# Patient Record
Sex: Male | Born: 1965 | Race: White | Hispanic: Yes | Smoking: Current some day smoker
Health system: Southern US, Community
[De-identification: ages and names within clinical notes are randomized; demographics above are authoritative.]

---

## 2007-01-22 IMAGING — CT HWWO
1 of 2 series · 13 of 30 positions shown, 17 images · non-contrast
Comparison: none

______________________________________________________________
STANGE, CARL-EMIL 06/25/1912 MASSIE, VIPIN [DATE]

REASON FOR CONSULTATION: Last Menstrual Date:..... NA Reason
p>for Consultation:. R/O Comment to Radiology:.... TODAY
 INFILTRATE
____________________________________________
EXAM: CHEST ROUTINE
Comparison is made to prior exam of 02 April, 1996.
The lungs are hyperexpanded compatible with chronic obstructive
pulmonary disease. The heart is mildly enlarged. There is tortuosity
of the thoracic aorta. No acute pulmonary infiltrate is noted and no
pleural fluid is seen. There is minimal chronic thickening of the
basilar interstitial markings. [

[Series 4: without contrast · axial · non-contrast · 0.49mm/px · z∈[+167,+297]mm · 13 of 32 slices shown, 17 images]
[im 3/32  brain]
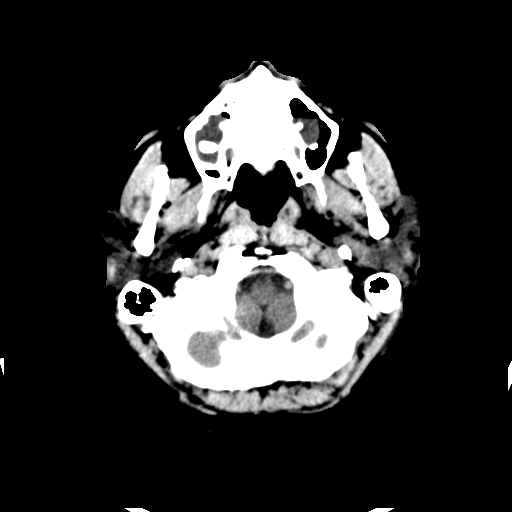
[im 3/32  bone]
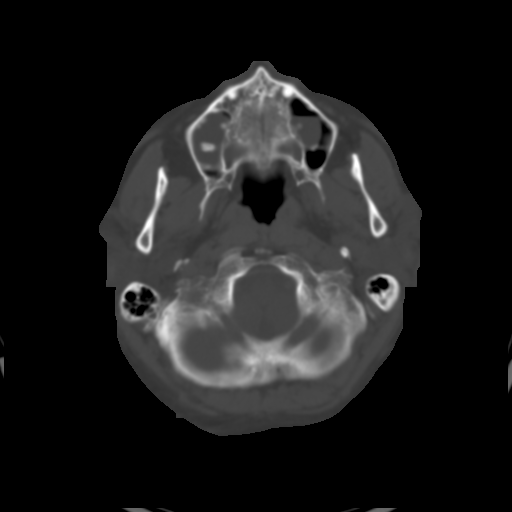
[im 5/32  brain]
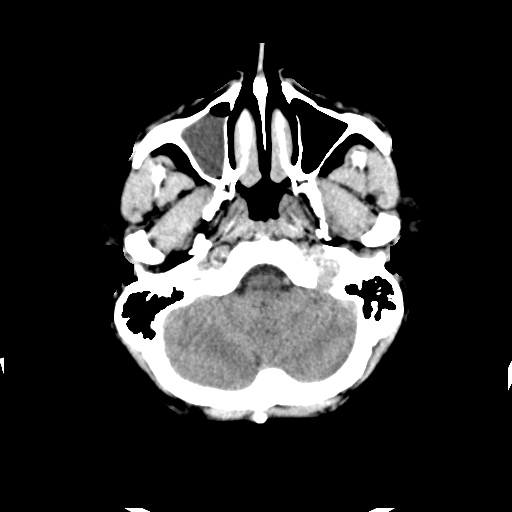
[im 7/32  brain]
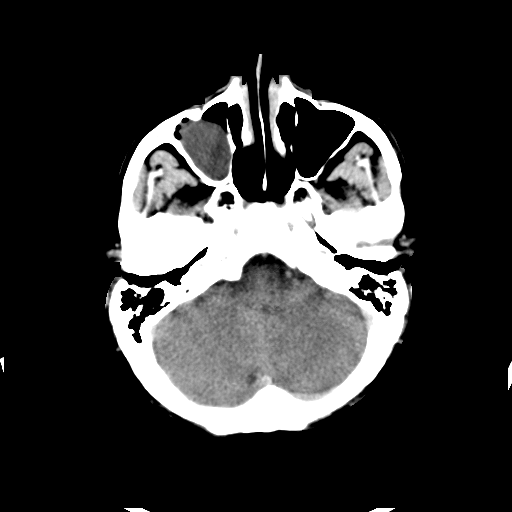
[im 9/32  brain]
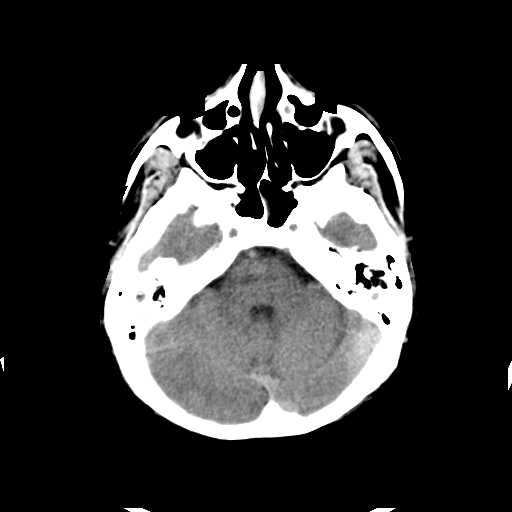
[im 12/32  brain]
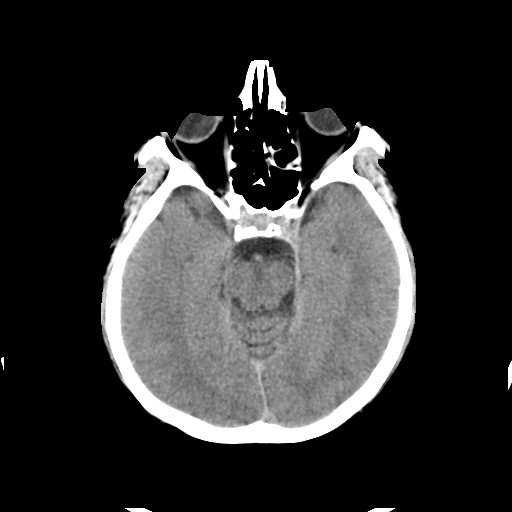
[im 12/32  bone]
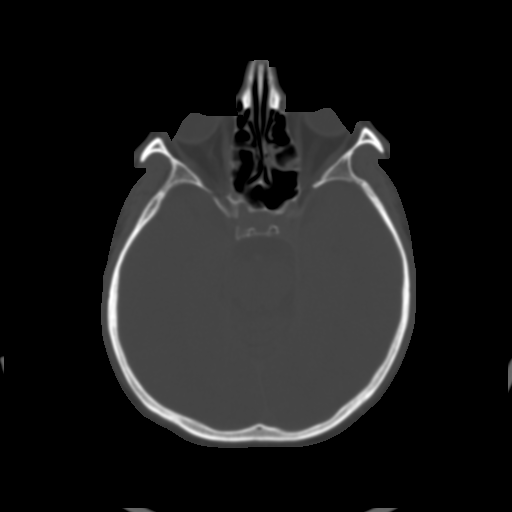
[im 14/32  brain]
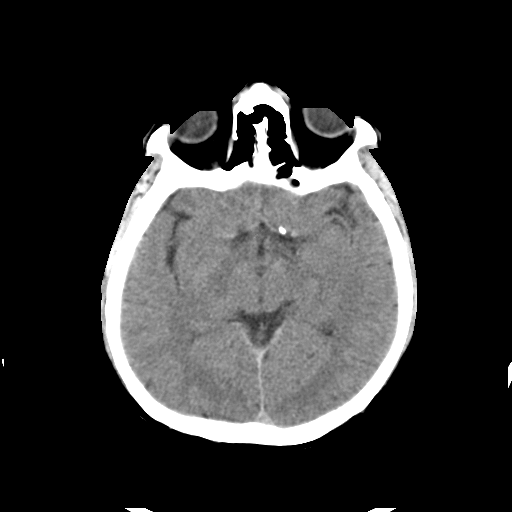
[im 16/32  brain]
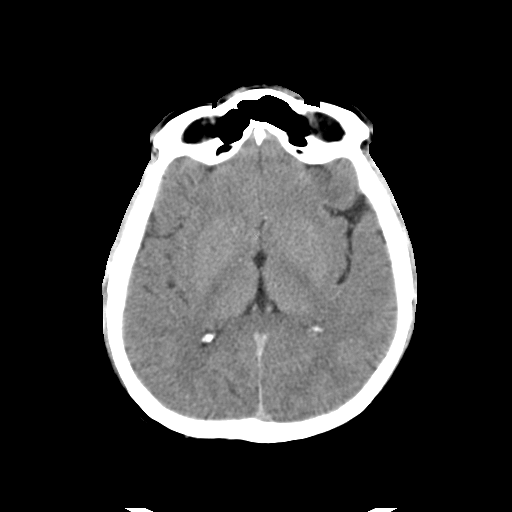
[im 18/32  brain]
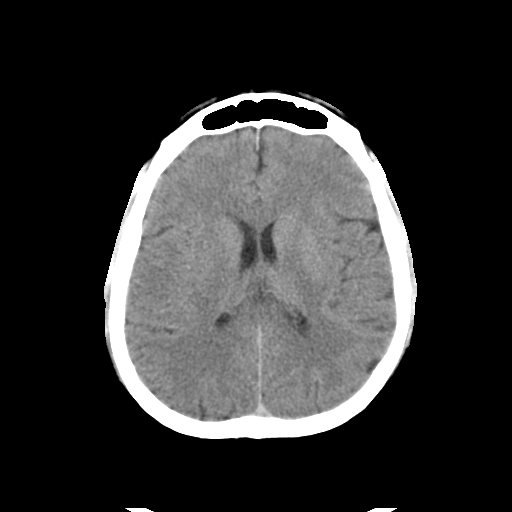
[im 20/32  brain]
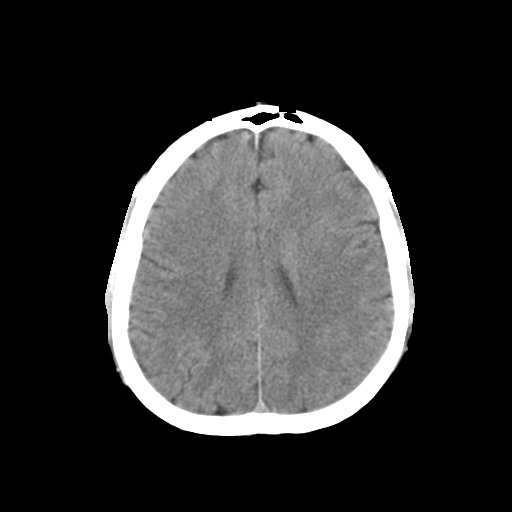
[im 20/32  bone]
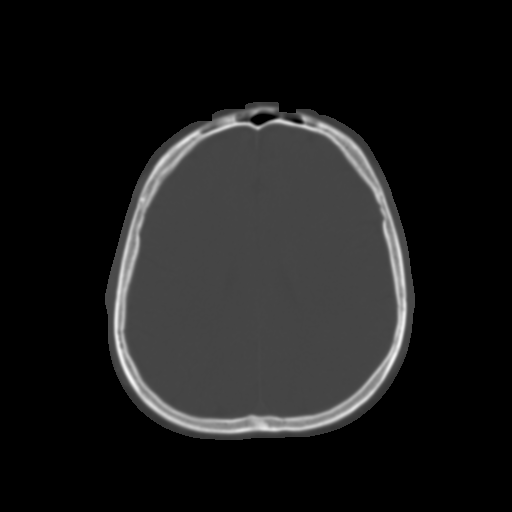
[im 23/32  brain]
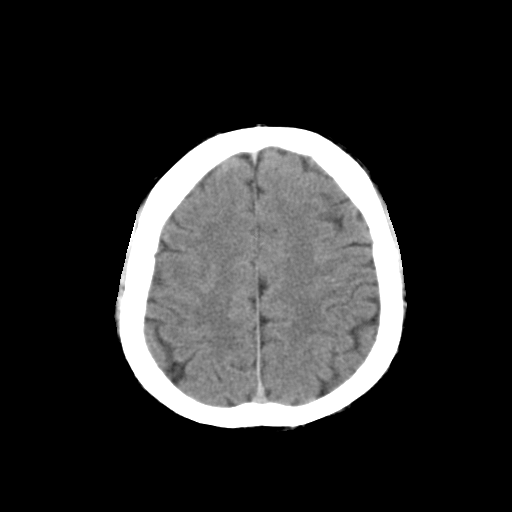
[im 25/32  brain]
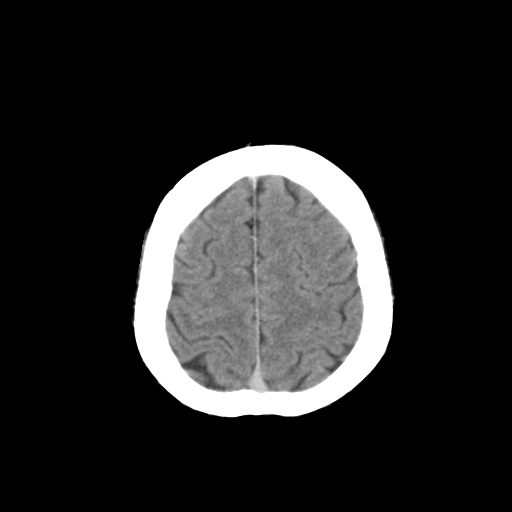
[im 27/32  brain]
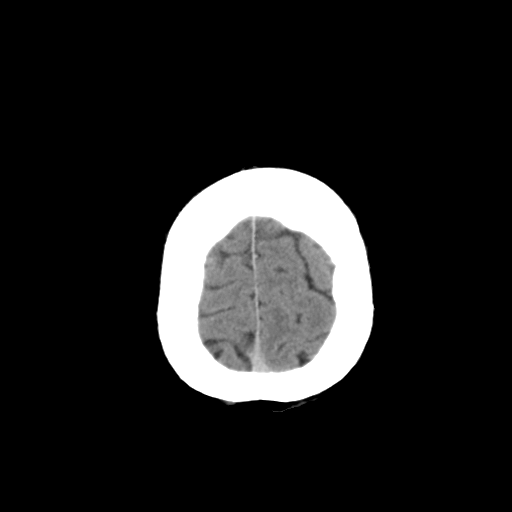
[im 29/32  brain]
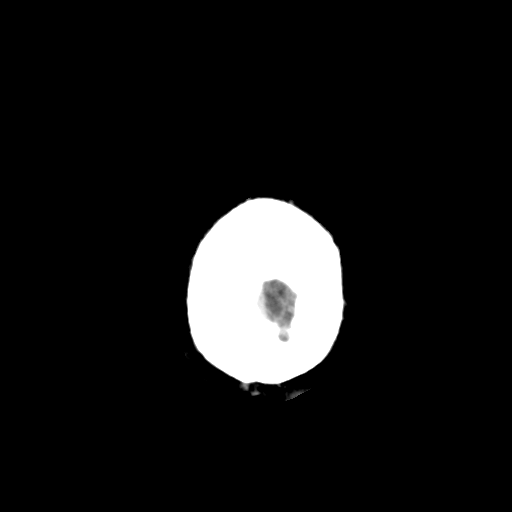
[im 29/32  bone]
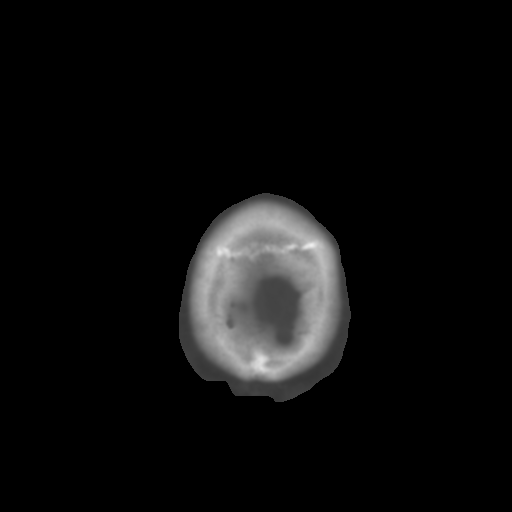

[13 of 30 positions shown; findings below may reference images not displayed]

CONCLUSION: 1. Chronic obstructive pulmonary disease.
2. Mild cardiomegaly. No acute process evident.

## 2008-01-08 ENCOUNTER — Emergency Department (HOSPITAL_COMMUNITY): Admission: EM | Admit: 2008-01-08 | Discharge: 2008-01-08 | Payer: Self-pay | Admitting: Emergency Medicine

## 2009-02-14 IMAGING — CR DG CHEST 2V
2 series · 2 of 2 positions shown · non-contrast
Comparison: None.

CLINICAL DATA: 41 year old male with chest pain and smoking history.  
 CHEST - 2 VIEW - 01/08/08:

[w chest pa]
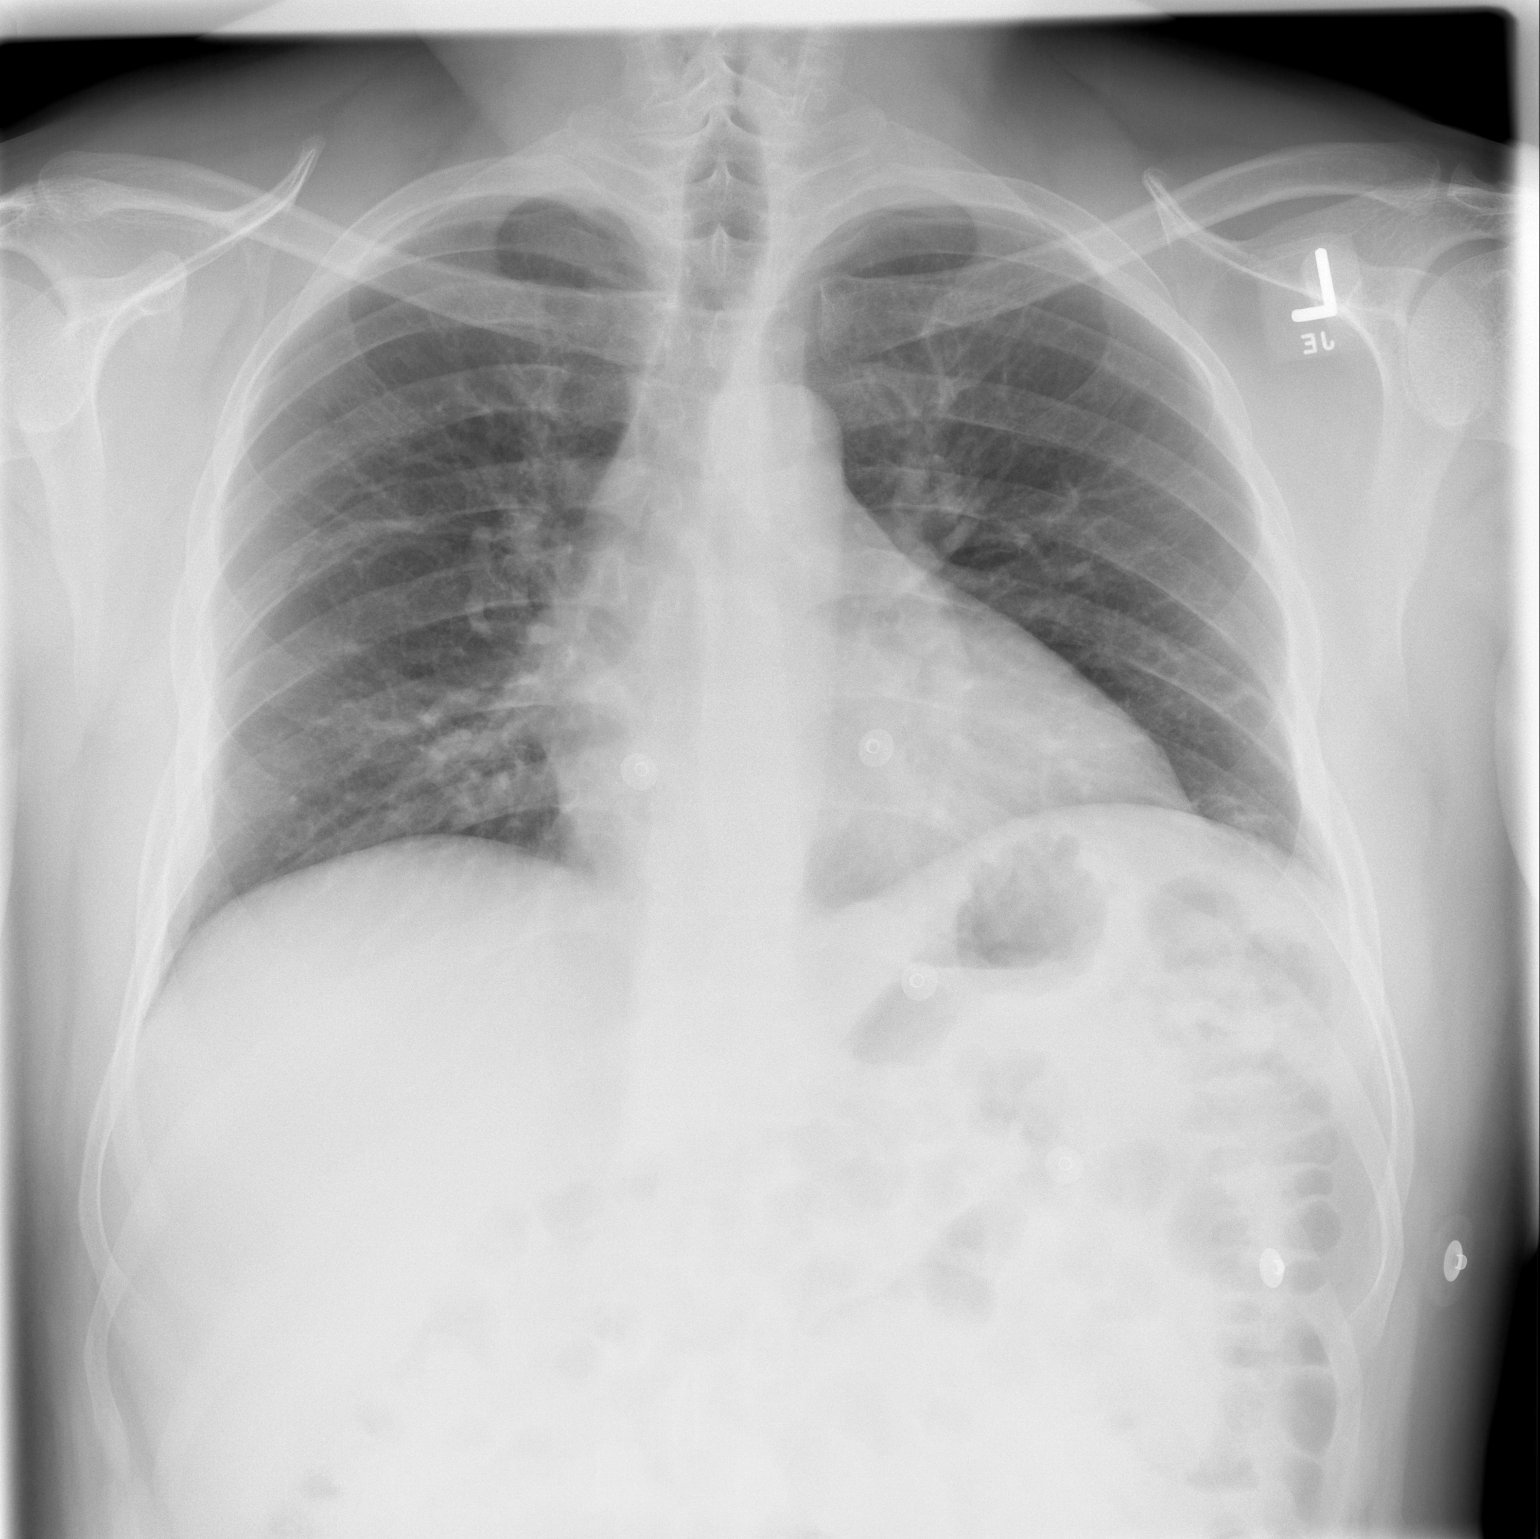

[w chest lat]
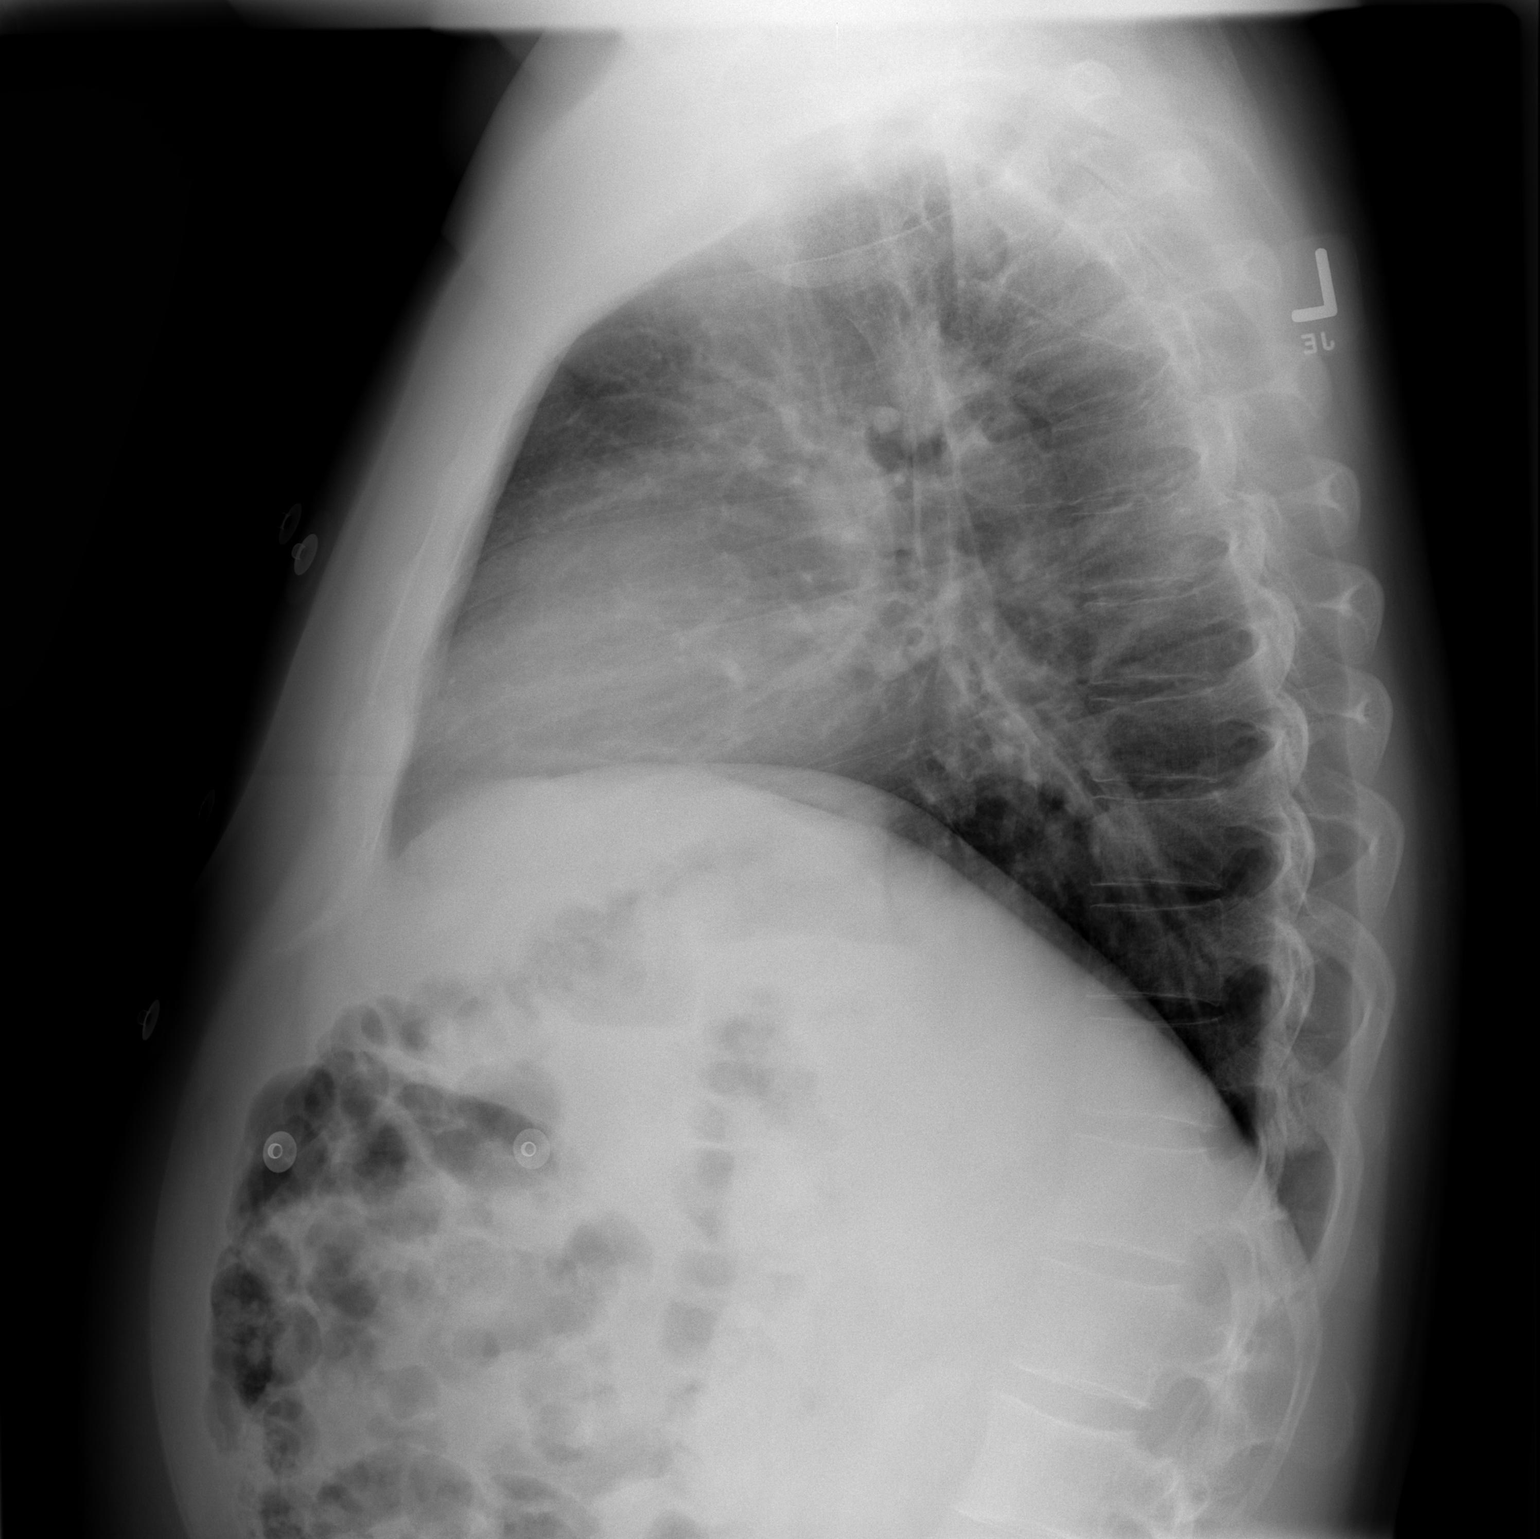

[2 of 2 positions shown; findings below may reference images not displayed]

FINDINGS: Shallow lung volumes.  Normal cardiac size and mediastinal contour.  No pleural effusion, pneumothorax, pulmonary edema, or confluent air space opacity.  There is vague asymmetric increased opacity at the right lung base medially on the frontal view.  This is probably in the lower lobe on the lateral view.  No acute osseous abnormality is seen.
IMPRESSION: Acute right lower lobe infection/bronchopneumonia suspected.

## 2009-12-07 ENCOUNTER — Ambulatory Visit: Payer: Self-pay | Admitting: Diagnostic Radiology

## 2009-12-07 ENCOUNTER — Emergency Department (HOSPITAL_BASED_OUTPATIENT_CLINIC_OR_DEPARTMENT_OTHER): Admission: EM | Admit: 2009-12-07 | Discharge: 2009-12-07 | Payer: Self-pay | Admitting: Emergency Medicine

## 2011-01-19 LAB — CBC
Hemoglobin: 15.7 g/dL (ref 13.0–17.0)
RDW: 12 % (ref 11.5–15.5)

## 2011-01-19 LAB — BASIC METABOLIC PANEL
CO2: 26 mEq/L (ref 19–32)
Calcium: 8.8 mg/dL (ref 8.4–10.5)
Chloride: 106 mEq/L (ref 96–112)
GFR calc non Af Amer: >60 mL/min (ref >60)
Glucose, Bld: 122 mg/dL — ABNORMAL HIGH (ref 70–99)
Potassium: 3.8 mEq/L (ref 3.5–5.1)

## 2011-01-19 LAB — DIFFERENTIAL
Basophils Absolute: 0.2 10*3/uL — ABNORMAL HIGH (ref 0.0–0.1)
Lymphs Abs: 1.8 10*3/uL (ref 0.7–4.0)
Monocytes Relative: 4 % (ref 3–12)
Neutrophils Relative %: 73 % (ref 43–77)

## 2011-07-24 LAB — I-STAT 8, (EC8 V) (CONVERTED LAB)
BUN: 9
Bicarbonate: 26.3 — ABNORMAL HIGH
Chloride: 108
Glucose, Bld: 106 — ABNORMAL HIGH
HCT: 47
Operator id: 270651
Potassium: 4.7
Sodium: 138

## 2011-07-24 LAB — POCT CARDIAC MARKERS
CKMB, poc: 1.3
Myoglobin, poc: 36.8
Operator id: 270651
Troponin i, poc: <0.05
Troponin i, poc: <0.05

## 2011-07-24 LAB — POCT I-STAT CREATININE: Creatinine, Ser: 1

## 2013-04-02 ENCOUNTER — Ambulatory Visit: Payer: Self-pay | Admitting: Internal Medicine

## 2021-02-28 ENCOUNTER — Encounter: Payer: Self-pay | Admitting: Medical

## 2021-02-28 ENCOUNTER — Other Ambulatory Visit: Payer: Self-pay

## 2021-02-28 ENCOUNTER — Ambulatory Visit: Payer: 59 | Admitting: Medical

## 2021-02-28 VITALS — BP 134/75 | HR 63 | Resp 18 | Ht 66.0 in | Wt 196.0 lb

## 2021-02-28 DIAGNOSIS — Z23 Encounter for immunization: Secondary | ICD-10-CM

## 2021-02-28 DIAGNOSIS — Z0000 Encounter for general adult medical examination without abnormal findings: Secondary | ICD-10-CM | POA: Diagnosis not present

## 2021-02-28 DIAGNOSIS — Z113 Encounter for screening for infections with a predominantly sexual mode of transmission: Secondary | ICD-10-CM

## 2021-02-28 DIAGNOSIS — Z1211 Encounter for screening for malignant neoplasm of colon: Secondary | ICD-10-CM | POA: Diagnosis not present

## 2021-02-28 DIAGNOSIS — M25511 Pain in right shoulder: Secondary | ICD-10-CM

## 2021-02-28 DIAGNOSIS — Z125 Encounter for screening for malignant neoplasm of prostate: Secondary | ICD-10-CM

## 2021-02-28 NOTE — Patient Instructions (Addendum)
For you wellness exam today I have ordered cbc, hiv  cmp, lipid panel and psa.  Tdap today.  Recommend exercise and healthy diet.  We will let you know lab results as they come in.  Follow up date appointment will be determined after lab review.    Preventive Care 47-54 Years Old, Male Preventive care refers to lifestyle choices and visits with your health care provider that can promote health and wellness. This includes:  A yearly physical exam. This is also called an annual wellness visit.  Regular dental and eye exams.  Immunizations.  Screening for certain conditions.  Healthy lifestyle choices, such as: ? Eating a healthy diet. ? Getting regular exercise. ? Not using drugs or products that contain nicotine and tobacco. ? Limiting alcohol use. What can I expect for my preventive care visit? Physical exam Your health care provider will check your:  Height and weight. These may be used to calculate your BMI (body mass index). BMI is a measurement that tells if you are at a healthy weight.  Heart rate and blood pressure.  Body temperature.  Skin for abnormal spots. Counseling Your health care provider may ask you questions about your:  Past medical problems.  Family's medical history.  Alcohol, tobacco, and drug use.  Emotional well-being.  Home life and relationship well-being.  Sexual activity.  Diet, exercise, and sleep habits.  Work and work Astronomer.  Access to firearms. What immunizations do I need? Vaccines are usually given at various ages, according to a schedule. Your health care provider will recommend vaccines for you based on your age, medical history, and lifestyle or other factors, such as travel or where you work.   What tests do I need? Blood tests  Lipid and cholesterol levels. These may be checked every 5 years, or more often if you are over 45 years old.  Hepatitis C test.  Hepatitis B test. Screening  Lung cancer  screening. You may have this screening every year starting at age 95 if you have a 30-pack-year history of smoking and currently smoke or have quit within the past 15 years.  Prostate cancer screening. Recommendations will vary depending on your family history and other risks.  Genital exam to check for testicular cancer or hernias.  Colorectal cancer screening. ? All adults should have this screening starting at age 64 and continuing until age 68. ? Your health care provider may recommend screening at age 21 if you are at increased risk. ? You will have tests every 1-10 years, depending on your results and the type of screening test.  Diabetes screening. ? This is done by checking your blood sugar (glucose) after you have not eaten for a while (fasting). ? You may have this done every 1-3 years.  STD (sexually transmitted disease) testing, if you are at risk. Follow these instructions at home: Eating and drinking  Eat a diet that includes fresh fruits and vegetables, whole grains, lean protein, and low-fat dairy products.  Take vitamin and mineral supplements as recommended by your health care provider.  Do not drink alcohol if your health care provider tells you not to drink.  If you drink alcohol: ? Limit how much you have to 0-2 drinks a day. ? Be aware of how much alcohol is in your drink. In the [place] one drink equals one 12 oz bottle of beer (355 mL), one 5 oz glass of wine (148 mL), or one 1 oz glass of hard liquor (44 mL).  Lifestyle  Take daily care of your teeth and gums. Brush your teeth every morning and night with fluoride toothpaste. Floss one time each day.  Stay active. Exercise for at least 30 minutes 5 or more days each week.  Do not use any products that contain nicotine or tobacco, such as cigarettes, e-cigarettes, and chewing tobacco. If you need help quitting, ask your health care provider.  Do not use drugs.  If you are sexually active, practice safe  sex. Use a condom or other form of protection to prevent STIs (sexually transmitted infections).  If told by your health care provider, take low-dose aspirin daily starting at age 75.  Find healthy ways to cope with stress, such as: ? Meditation, yoga, or listening to music. ? Journaling. ? Talking to a trusted person. ? Spending time with friends and family. Safety  Always wear your seat belt while driving or riding in a vehicle.  Do not drive: ? If you have been drinking alcohol. Do not ride with someone who has been drinking. ? When you are tired or distracted. ? While texting.  Wear a helmet and other protective equipment during sports activities.  If you have firearms in your house, make sure you follow all gun safety procedures. What's next?  Go to your health care provider once a year for an annual wellness visit.  Ask your health care provider how often you should have your eyes and teeth checked.  Stay up to date on all vaccines. This information is not intended to replace advice given to you by your health care provider. Make sure you discuss any questions you have with your health care provider. Document Revised: 07/15/2019 Document Reviewed: 10/10/2018 Elsevier Patient Education  2021 [employer].

## 2021-02-28 NOTE — Addendum Note (Signed)
Addended by: Maximino Sarin on: 02/28/2021 03:05 PM   Modules accepted: Orders

## 2021-02-28 NOTE — Progress Notes (Signed)
Subjective:    Patient ID: Austin Brewer, male    DOB: 09-Jun-1966, 55 y.o.   MRN: [id number]  HPI Pt in for first time here.  Pt used to see MD in [facility].  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 He works out once a week. He states eats healthy. On weekends smokes 2 cigarettes a day on weekends. Social drinker 2-3 beer a day on weekends.   No major medical problems.   Pt never had colonoscopy.  Pt not sure when last tetanus was. State more than 10 years.    Review of Systems  Constitutional: Negative for chills, fatigue and fever.  Eyes: Negative for redness and visual disturbance.  Respiratory: Negative for cough, chest tightness, shortness of breath and wheezing.   Cardiovascular: Negative for chest pain and palpitations.  Gastrointestinal: Negative for abdominal distention, anal bleeding, diarrhea, rectal pain and vomiting.  Genitourinary: Negative for dysuria and frequency.  Musculoskeletal: Negative for back pain.       Pt has on and off pain in rt. At times has pain on abduction. Sometimes pain for one week other times resolves. Pain for about 6 months. No accident for fall.  Skin: Negative for rash.  Neurological: Negative for dizziness, seizures, weakness and headaches.  Hematological: Negative for adenopathy. Does not bruise/bleed easily.  Psychiatric/Behavioral: Negative for behavioral problems, decreased concentration, sleep disturbance and suicidal ideas. The patient is not nervous/anxious.     No past medical history on file.   Social History   Socioeconomic History  . Marital status: Legally Separated    Spouse name: Not on file  . Number of children: Not on file  . Years of education: Not on file  . Highest education level: Not on file  Occupational History  . Not on file  Tobacco Use  . Smoking status: Not on file  . Smokeless tobacco: Not on file  Substance and Sexual Activity  . Alcohol use: Not on file  . Drug use: Not on file  . Sexual  activity: Not on file  Other Topics Concern  . Not on file  Social History Narrative  . Not on file   Social Determinants of Health   Financial Resource Strain: Not on file  Food Insecurity: Not on file  Transportation Needs: Not on file  Physical Activity: Not on file  Stress: Not on file  Social Connections: Not on file  Intimate Partner Violence: Not on file     No family history on file.  Not on File  No current outpatient medications on file prior to visit.   No current facility-administered medications on file prior to visit.    BP 134/75   Pulse 63   Resp 18   Ht 5\' 6"  (1.676 m)   Wt 196 lb (88.9 kg)   SpO2 98%   BMI 31.64 kg/m       Objective:   Physical Exam  General Mental Status- Alert. General Appearance- Not in acute distress.   Skin General: Color- Normal Color. Moisture- Normal Moisture.  Neck Carotid Arteries- Normal color. Moisture- Normal Moisture. No carotid bruits. No JVD.  Chest and Lung Exam Auscultation: Breath Sounds:-Normal.  Cardiovascular Auscultation:Rythm- Regular. Murmurs & Other Heart Sounds:Auscultation of the heart reveals- No Murmurs.  Abdomen Inspection:-Inspeection Normal. Palpation/Percussion:Note:No mass. Palpation and Percussion of the abdomen reveal- Non Tender, Non Distended + BS, no rebound or guarding.   Neurologic Cranial Nerve exam:- CN III-XII intact(No nystagmus), symmetric smile. Strength:- 5/5 equal and symmetric strength both upper  and lower extremities.  Left shoulder- good range of motion. No crepitus. No pain on palpation.  Assessment & Plan:  For you wellness exam today I have ordered cbc, hiv  cmp, lipid panel and psa.  Tdap today.  Recommend exercise and healthy diet.  We will let you know lab results as they come in.  Follow up date appointment will be determined after lab review.   Esperanza Richters, PA-C

## 2021-03-01 LAB — CBC WITH DIFFERENTIAL/PLATELET
Basophils Absolute: 0.1 10*3/uL (ref 0.0–0.1)
Basophils Relative: 0.8 % (ref 0.0–3.0)
Eosinophils Absolute: 0.3 10*3/uL (ref 0.0–0.7)
Eosinophils Relative: 4.6 % (ref 0.0–5.0)
HCT: 44.1 % (ref 39.0–52.0)
Hemoglobin: 14.9 g/dL (ref 13.0–17.0)
Lymphocytes Relative: 30.6 % (ref 12.0–46.0)
Lymphs Abs: 2.1 10*3/uL (ref 0.7–4.0)
MCHC: 33.7 g/dL (ref 30.0–36.0)
MCV: 93.7 fl (ref 78.0–100.0)
Monocytes Absolute: 0.5 10*3/uL (ref 0.1–1.0)
Monocytes Relative: 7.1 % (ref 3.0–12.0)
Neutro Abs: 3.9 10*3/uL (ref 1.4–7.7)
Neutrophils Relative %: 56.9 % (ref 43.0–77.0)
Platelets: 254 10*3/uL (ref 150.0–400.0)
RBC: 4.71 Mil/uL (ref 4.22–5.81)
RDW: 12.8 % (ref 11.5–15.5)
WBC: 6.9 10*3/uL (ref 4.0–10.5)

## 2021-03-01 LAB — COMPREHENSIVE METABOLIC PANEL
ALT: 22 U/L (ref 0–53)
AST: 19 U/L (ref 0–37)
Albumin: 4.4 g/dL (ref 3.5–5.2)
Alkaline Phosphatase: 74 U/L (ref 39–117)
BUN: 13 mg/dL (ref 6–23)
CO2: 27 mEq/L (ref 19–32)
Calcium: 9.5 mg/dL (ref 8.4–10.5)
Chloride: 104 mEq/L (ref 96–112)
Creatinine, Ser: 0.92 mg/dL (ref 0.40–1.50)
GFR: 94.71 mL/min (ref >60.00)
Glucose, Bld: 90 mg/dL (ref 70–99)
Potassium: 4.5 mEq/L (ref 3.5–5.1)
Sodium: 138 mEq/L (ref 135–145)
Total Bilirubin: 0.8 mg/dL (ref 0.2–1.2)
Total Protein: 7.2 g/dL (ref 6.0–8.3)

## 2021-03-01 LAB — PSA: PSA: 1.82 ng/mL (ref 0.10–4.00)

## 2021-03-01 LAB — HIV ANTIBODY (ROUTINE TESTING W REFLEX): HIV 1&2 Ab, 4th Generation: NONREACTIVE

## 2021-03-01 LAB — LIPID PANEL
Cholesterol: 240 mg/dL — ABNORMAL HIGH (ref 0–200)
HDL: 52.7 mg/dL (ref >39.00)
NonHDL: 187.62
Total CHOL/HDL Ratio: 5
Triglycerides: 246 mg/dL — ABNORMAL HIGH (ref 0.0–149.0)
VLDL: 49.2 mg/dL — ABNORMAL HIGH (ref 0.0–40.0)

## 2021-03-01 LAB — LDL CHOLESTEROL, DIRECT: Direct LDL: 157 mg/dL

## 2021-03-15 ENCOUNTER — Ambulatory Visit: Payer: Self-pay

## 2021-03-15 ENCOUNTER — Encounter: Payer: Self-pay | Admitting: Family Medicine

## 2021-03-15 ENCOUNTER — Ambulatory Visit (INDEPENDENT_AMBULATORY_CARE_PROVIDER_SITE_OTHER): Payer: 59 | Admitting: Family Medicine

## 2021-03-15 ENCOUNTER — Other Ambulatory Visit: Payer: Self-pay

## 2021-03-15 VITALS — BP 106/84 | Ht 66.0 in | Wt 190.0 lb

## 2021-03-15 DIAGNOSIS — M19019 Primary osteoarthritis, unspecified shoulder: Secondary | ICD-10-CM | POA: Insufficient documentation

## 2021-03-15 DIAGNOSIS — G8929 Other chronic pain: Secondary | ICD-10-CM

## 2021-03-15 MED ORDER — PENNSAID 2 % EX SOLN: 1.0000 "application " | Freq: Two times a day (BID) | CUTANEOUS | 2 refills | Status: AC

## 2021-03-15 NOTE — Assessment & Plan Note (Signed)
Symptoms seem most consistent with AC joint.  Reassuring exam of his rotator cuff. -Counseled on home exercise therapy and supportive care. -pennsaid -Could consider injection if needed.

## 2021-03-15 NOTE — Patient Instructions (Addendum)
Nice to meet you Please try ice as needed  Please try the exercises  Please use the rub on medicine as needed   Please send me a message in MyChart with any questions or updates.  Please see [place] back as needed.   --Dr. Astrid Divine placer conocerte Por favor, pruebe con hielo segn sea necesario. Por favor, prueba los ejercicios. Utilice el medicamento para frotar segn sea necesario. Enveme un mensaje en MyChart con cualquier pregunta o actualizacin. Vuelva a vernos cuando sea necesario.

## 2021-03-15 NOTE — Progress Notes (Signed)
  Austin Brewer - 55 y.o. male MRN [id number]  Date of birth: 1966-08-10  SUBJECTIVE:  Including CC & ROS.  No chief complaint on file.   Austin Brewer is a 55 y.o. male that is presenting with right shoulder pain.  The pain is acute on chronic in nature.  It is intermittent in nature.  No injury or inciting event.  Has not tried any medications thus far.  Interview was conducted with an in person Spanish interpreter.  Review of Systems See HPI   HISTORY: Past Medical, Surgical, Social, and Family History Reviewed & Updated per EMR.   Pertinent Historical Findings include:  History reviewed. No pertinent past medical history.  History reviewed. No pertinent surgical history.  History reviewed. No pertinent family history.  Social History   Socioeconomic History  . Marital status: Legally Separated    Spouse name: Not on file  . Number of children: Not on file  . Years of education: Not on file  . Highest education level: Not on file  Occupational History  . Not on file  Tobacco Use  . Smoking status: Current Some Day Smoker    Packs/day: 0.25    Years: 0.50    Pack years: 0.12    Types: Cigarettes  . Smokeless tobacco: Never Used  . Tobacco comment: 4 cigarettes on weekends.  Substance and Sexual Activity  . Alcohol use: Yes    Comment: 2 beers saturday and sunday.  . Drug use: Not on file  . Sexual activity: Yes  Other Topics Concern  . Not on file  Social History Narrative  . Not on file   Social Determinants of Health   Financial Resource Strain: Not on file  Food Insecurity: Not on file  Transportation Needs: Not on file  Physical Activity: Not on file  Stress: Not on file  Social Connections: Not on file  Intimate Partner Violence: Not on file     PHYSICAL EXAM:  VS: BP 106/84 (BP Location: Left Arm, Patient Position: Sitting, Cuff Size: Normal)   Ht 5\' 6"  (1.676 m)   Wt 190 lb (86.2 kg)   BMI 30.67 kg/m  Physical Exam Gen: NAD, alert,  cooperative with exam, well-appearing MSK:  Right shoulder: Normal range of motion.  Normal strength resistance. Positive crossarm test. Neurovascular intact  Limited ultrasound: Right shoulder:  Normal-appearing biceps tendon long and short axis. Normal subscapularis. Normal supraspinatus in static and dynamic testing. AC joint with degenerative changes. Normal posterior glenohumeral joint.  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  Reassuring exam of his rotator cuff. -Counseled on home exercise therapy and supportive care. -pennsaid -Could consider injection if needed.

## 2023-02-15 ENCOUNTER — Encounter: Payer: Self-pay | Admitting: *Deleted
